# Patient Record
Sex: Female | Born: 2000 | Race: White | Hispanic: No | Marital: Single | Smoking: Never smoker
Health system: Southern US, Community
[De-identification: ages and names within clinical notes are randomized; demographics above are authoritative.]

---
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
## 2019-03-12 ENCOUNTER — Telehealth: Payer: Self-pay

## 2019-03-12 NOTE — Telephone Encounter (Signed)
Copied from [place] [id number]. Topic: General - Other >> Mar 12, 2019 12:25 PM Alease Frame wrote: Reason for CRM: patient is receiving vm concerning covid prescreening and she does not have an upcoming appt sch . Please advise error

## 2019-03-20 IMAGING — CT CT HEAD W/O CM
3 series · 15 of 47 positions shown, 18 images · non-contrast
Comparison: None.

CLINICAL DATA: 15-year-old female with frontal headache and
transient episode of right eye vision loss acute onset 2 hours ago.

EXAM:
CT HEAD WITHOUT CONTRAST
TECHNIQUE: Contiguous axial images were obtained from the base of the skull
through the vertex without intravenous contrast.

[Series 2: head wo · axial · 0.44mm/px · z∈[+52,+177]mm · 9 of 31 slices shown, 12 images]
[im 3/31  brain]
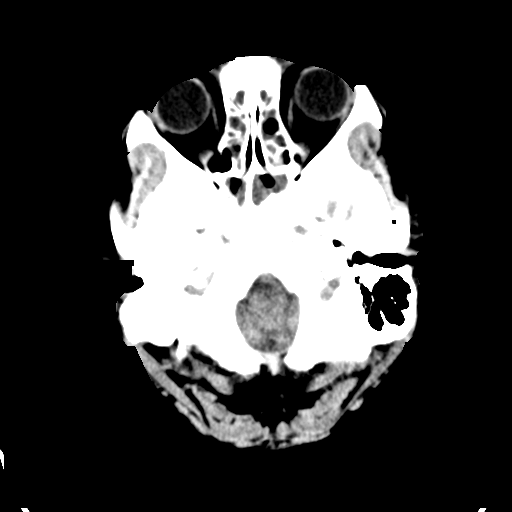
[im 3/31  bone]
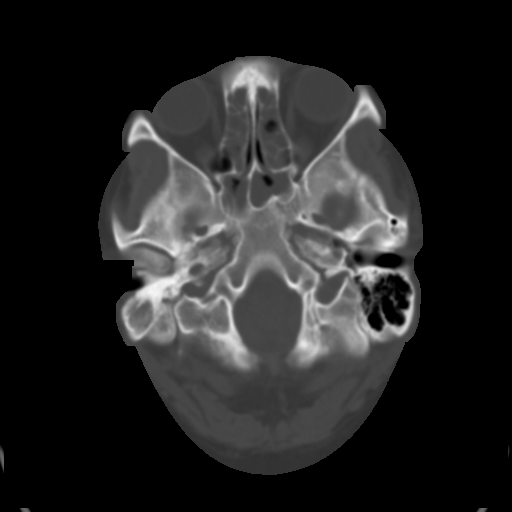
[im 6/31  brain]
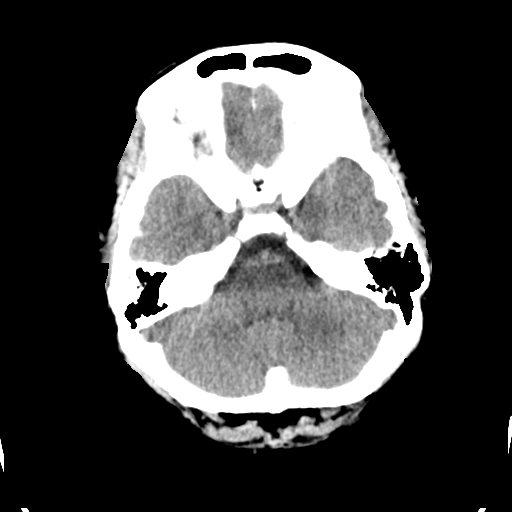
[im 9/31  brain]
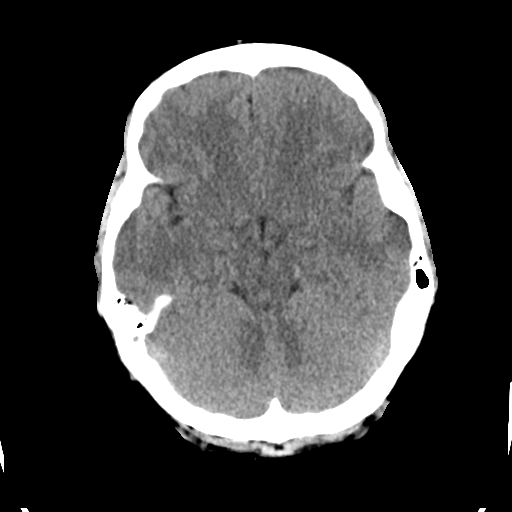
[im 12/31  brain]
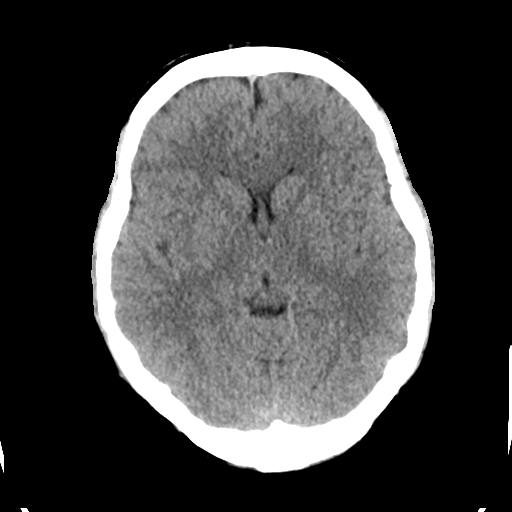
[im 16/31  brain]
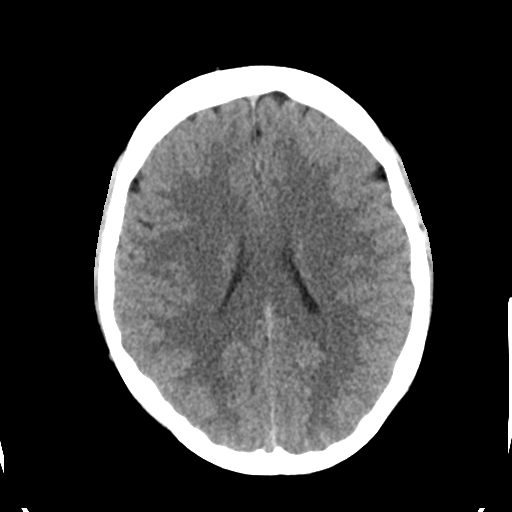
[im 16/31  bone]
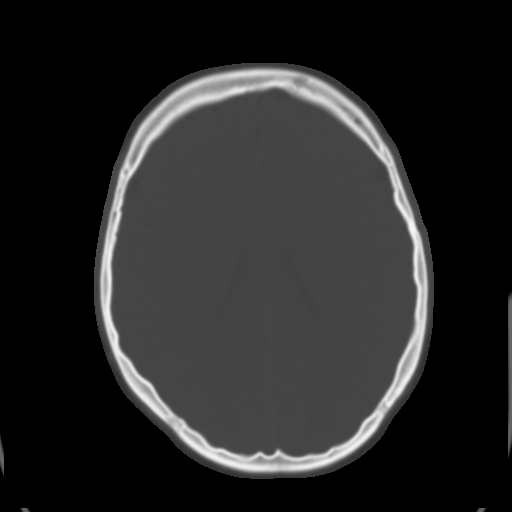
[im 19/31  brain]
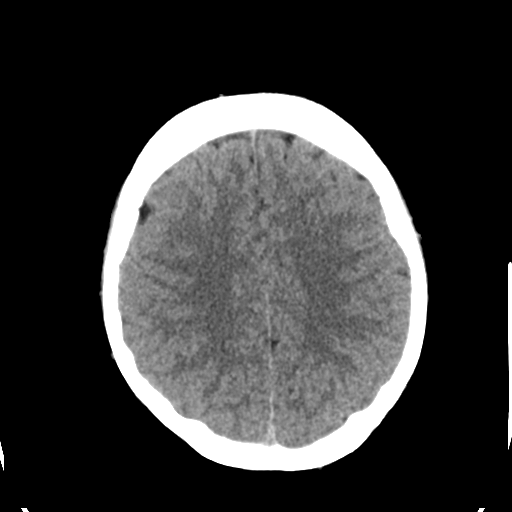
[im 22/31  brain]
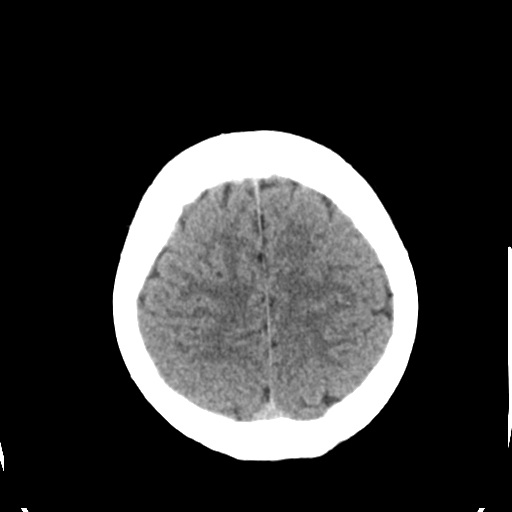
[im 25/31  brain]
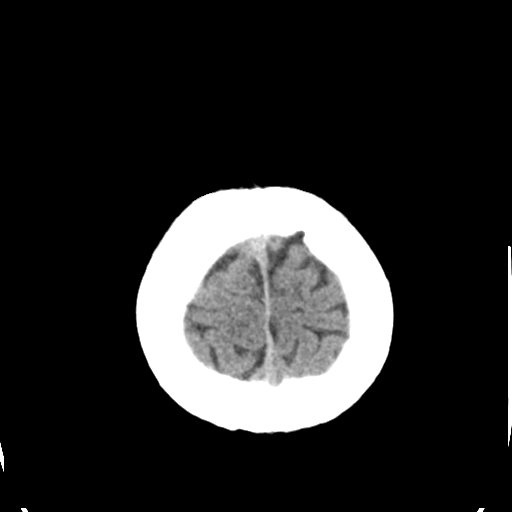
[im 28/31  brain]
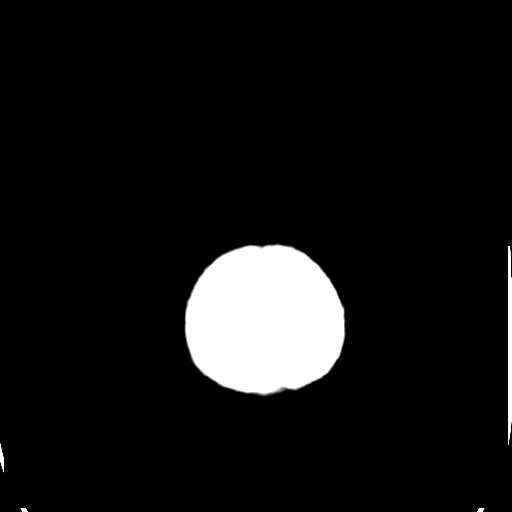
[im 28/31  bone]
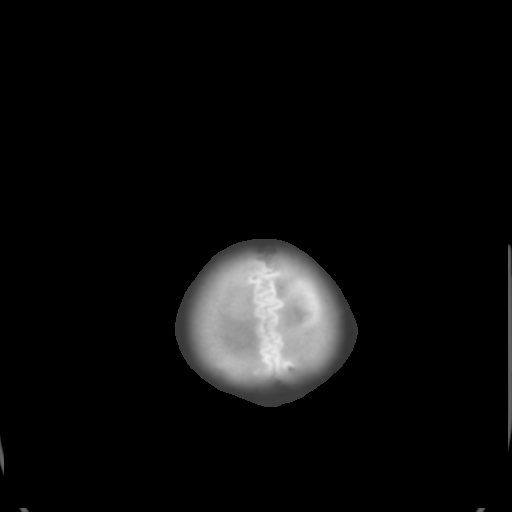

[Series 4: coronal soft tissue · coronal · 0.32mm/px · 3 of 68 slices shown]
[im 23/68  brain]
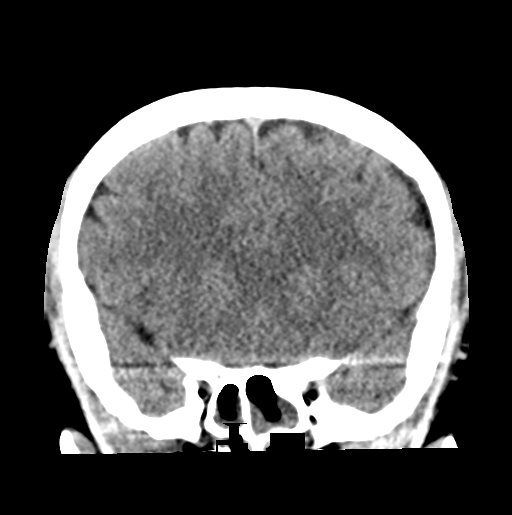
[im 30/68  brain]
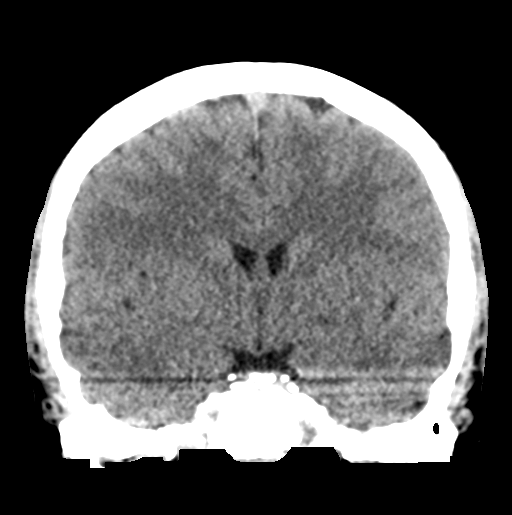
[im 38/68  brain]
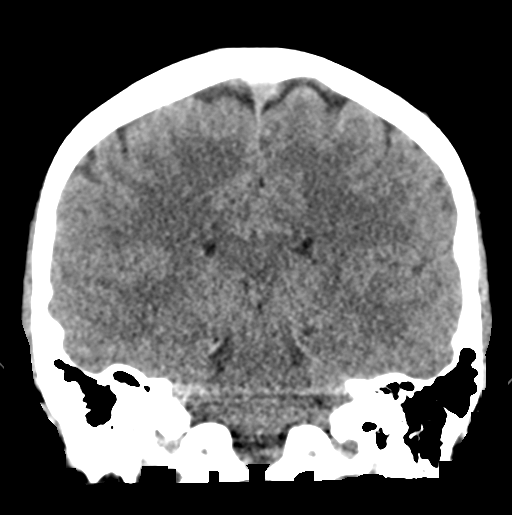

[Series 5: sagittal soft tissue · sagittal · 0.28mm/px · 3 of 57 slices shown]
[im 19/57  brain]
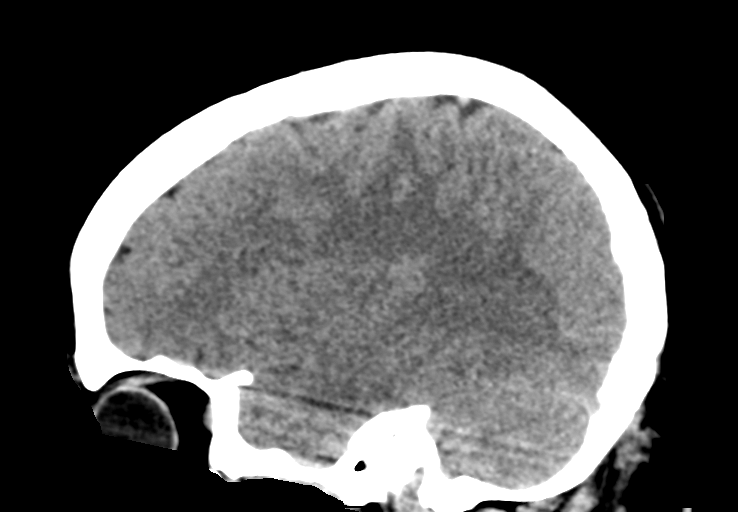
[im 29/57  brain]
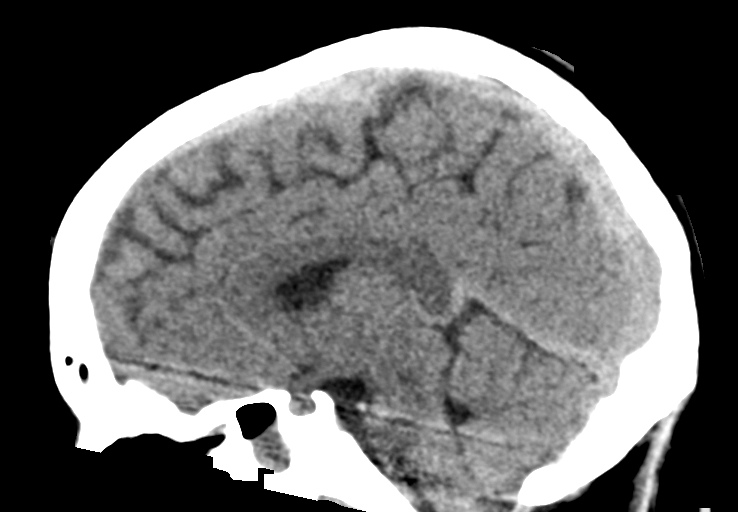
[im 38/57  brain]
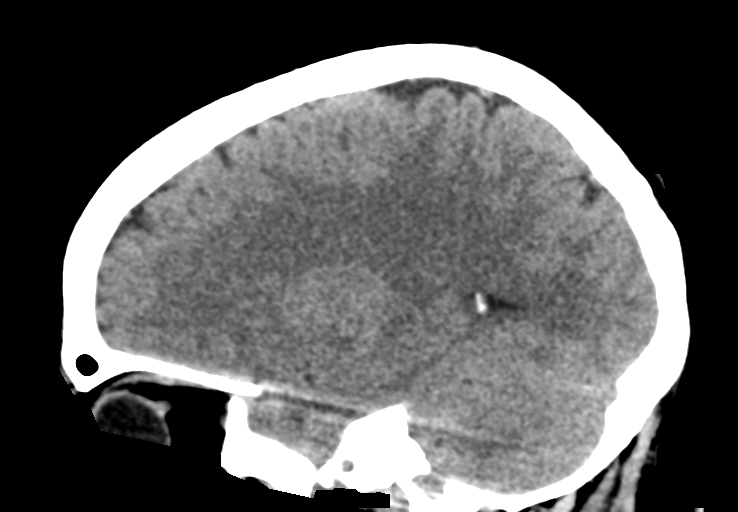

[15 of 47 positions shown; findings below may reference images not displayed]

FINDINGS: Brain: Cerebral volume is normal. No midline shift,
ventriculomegaly, mass effect, evidence of mass lesion, intracranial
hemorrhage or evidence of cortically based acute infarction.
Gray-white matter differentiation is within normal limits throughout
the brain.

Vascular: No suspicious intracranial vascular hyperdensity.

Skull: Normal.  No acute osseous abnormality identified.

Sinuses/Orbits: Subtotal opacification of the bilateral ethmoid and
sphenoid sinuses. Mucosal thickening or fluid levels in the inferior
frontal sinuses. The visible maxillary sinuses are clear except for
trace bubbly opacity on the left.

Bilateral tympanic cavities and mastoids are clear.

Other: Visualized orbits and scalp soft tissues are within normal
limits.
IMPRESSION: 1.  Normal noncontrast CT appearance of the brain.
2. Widespread bilateral paranasal sinus inflammation suggesting
acute sinusitis.

## 2019-06-01 ENCOUNTER — Other Ambulatory Visit: Payer: Self-pay

## 2019-06-01 ENCOUNTER — Ambulatory Visit: Payer: 59 | Attending: Internal Medicine

## 2019-06-01 DIAGNOSIS — Z20822 Contact with and (suspected) exposure to covid-19: Secondary | ICD-10-CM

## 2019-06-02 LAB — NOVEL CORONAVIRUS, NAA: SARS-CoV-2, NAA: NOT DETECTED

## 2020-03-08 ENCOUNTER — Ambulatory Visit: Payer: 59 | Admitting: Adult Health

## 2020-03-08 ENCOUNTER — Encounter: Payer: Self-pay | Admitting: Adult Health

## 2020-03-08 VITALS — BP 129/75 | HR 82 | Ht 64.0 in | Wt 218.0 lb

## 2020-03-08 DIAGNOSIS — N6320 Unspecified lump in the left breast, unspecified quadrant: Secondary | ICD-10-CM | POA: Diagnosis not present

## 2020-03-08 DIAGNOSIS — N6310 Unspecified lump in the right breast, unspecified quadrant: Secondary | ICD-10-CM

## 2020-03-08 NOTE — Progress Notes (Signed)
  Subjective:     Patient ID: Paula Gonzales, female   DOB: 27-Aug-2000, 19 y.o.   MRN: [id number]  HPI Paula Gonzales is a 19 year old white female,single, G0P0, in complaining of bilateral breast lumps for about a week and they feel achy. She is nursing student at [employer]  PCP is Dr Donnie Coffin.   Review of Systems Noticed lumps in both breast for a about a week Breasts are achy Has never had sex Reviewed past medical,surgical, social and family history. Reviewed medications and allergies.     Objective:   Physical Exam BP 129/75 (BP Location: Left Arm, Patient Position: Sitting, Cuff Size: Large)   Pulse 82   Ht 5\' 4"  (1.626 m)   Wt 218 lb (98.9 kg)   LMP 02/07/2020   BMI 37.42 kg/m   Skin warm and dry,  Breasts:No retraction or nipple discharge in either breasts, has bilateral regular irregularities and has 1 cm oval mass at 1-2 o'clock right breast 2 FB from areola, non tender, on the left has round 1 cm non tender mass at 9 0'clock 2 FB from areola Fall risk is low AA is 3 PHQ 9 score is 2.  Upstream - 03/08/20 1526      Pregnancy Intention Screening   Does the patient want to become pregnant in the next year? No    Does the patient's partner want to become pregnant in the next year? No    Would the patient like to discuss contraceptive options today? No      Contraception Wrap Up   Current Method Abstinence    End Method Abstinence    Contraception Counseling Provided No              Assessment:     1. Breast mass, left Will get left breast 03/10/20 11/23 at 3:50 pm at [facility]  2. Breast mass, right   Will get right breast [facility] 11/23 at 3:50 pm at [facility]  Plan:    Discussed with her that breasts have regular irregularities, fibroglandular tissue  Follow up prn

## 2020-04-12 ENCOUNTER — Encounter (HOSPITAL_COMMUNITY): Payer: Self-pay

## 2020-04-12 ENCOUNTER — Ambulatory Visit (HOSPITAL_COMMUNITY)
Admission: RE | Admit: 2020-04-12 | Discharge: 2020-04-12 | Disposition: A | Discharge status: Home / Self Care | Payer: 59 | Source: Ambulatory Visit | Attending: Adult Health | Admitting: Adult Health

## 2020-04-12 ENCOUNTER — Other Ambulatory Visit: Payer: Self-pay

## 2020-04-12 DIAGNOSIS — N6310 Unspecified lump in the right breast, unspecified quadrant: Secondary | ICD-10-CM

## 2020-04-12 DIAGNOSIS — N6320 Unspecified lump in the left breast, unspecified quadrant: Secondary | ICD-10-CM | POA: Insufficient documentation

## 2020-11-28 ENCOUNTER — Ambulatory Visit
Admission: RE | Admit: 2020-11-28 | Discharge: 2020-11-28 | Disposition: A | Discharge status: Home / Self Care | Payer: 59 | Source: Ambulatory Visit | Attending: Family Medicine | Admitting: Family Medicine

## 2020-11-28 ENCOUNTER — Other Ambulatory Visit: Payer: Self-pay

## 2020-11-28 VITALS — BP 152/82 | HR 103 | Temp 98.9°F | Resp 20

## 2020-11-28 DIAGNOSIS — J019 Acute sinusitis, unspecified: Secondary | ICD-10-CM

## 2020-11-28 DIAGNOSIS — H6591 Unspecified nonsuppurative otitis media, right ear: Secondary | ICD-10-CM | POA: Diagnosis not present

## 2020-11-28 MED ORDER — FLUCONAZOLE 150 MG PO TABS
150.0000 mg | ORAL_TABLET | Freq: Once | ORAL | 0 refills | Status: AC
Start: 2020-11-28 — End: 2020-11-28

## 2020-11-28 MED ORDER — AMOXICILLIN 875 MG PO TABS: 875.0000 mg | ORAL_TABLET | Freq: Two times a day (BID) | ORAL | 0 refills | Status: AC

## 2020-11-28 NOTE — ED Triage Notes (Signed)
Pt presents with right side ear pain and nasal congestion since Thursday

## 2020-11-28 NOTE — ED Provider Notes (Signed)
[facility]    CSN: [id number] Arrival date & time: 11/28/20  0843      History   Chief Complaint Chief Complaint  Patient presents with   Nasal Congestion   Otalgia    HPI Paula Gonzales is a 20 y.o. female.   HPI Patient presents with URI symptoms including cough, sore throat, right ear pain and pressure, asal congestion, runny nose, and sinus pressure. Diagnosed with COVID >7 days ago. Symptoms were minor.patient is fully vaccinated.  Patient has been taken Sudafed along with allergy medications without any relief of current symptoms.  She denies any shortness of breath or wheezing or chest tightness.   History reviewed. No pertinent past medical history.  There are no problems to display for this patient.   History reviewed. No pertinent surgical history.  OB History     Gravida  0   Para  0   Term  0   Preterm  0   AB  0   Living  0      SAB  0   IAB  0   Ectopic  0   Multiple  0   Live Births  0            Home Medications    Prior to Admission medications   Medication Sig Start Date End Date Taking? Authorizing Provider  amoxicillin (AMOXIL) 875 MG tablet Take 1 tablet (875 mg total) by mouth 2 (two) times daily. 11/28/20  Yes Bing Neighbors, FNP  fluconazole (DIFLUCAN) 150 MG tablet Take 1 tablet (150 mg total) by mouth once for 1 dose. Repeat if needed 11/28/20 11/28/20 Yes Bing Neighbors, FNP  cetirizine (ZYRTEC) 10 MG tablet Take by mouth.    [provider]  ibuprofen (ADVIL,MOTRIN) 200 MG tablet Take 400 mg by mouth every 6 (six) hours as needed.    [provider]  Multiple Vitamins-Minerals (ONE-A-DAY WOMENS PO) Take by mouth.    [provider]    Family History Family History  Problem Relation Age of Onset   Heart attack Paternal Grandfather    Other Paternal Grandmother        head on collision   Cancer Maternal Grandmother    Breast cancer Maternal Grandmother    Cancer  Maternal Grandfather    Diabetes Mother    Asthma Mother    Breast cancer Maternal Aunt     Social History Social History   Tobacco Use   Smoking status: Never   Smokeless tobacco: Never  Vaping Use   Vaping Use: Never used  Substance Use Topics   Alcohol use: Yes    Comment: occ   Drug use: No     Allergies   Patient has no known allergies.   Review of Systems Review of Systems Pertinent negatives listed in HPI   Physical Exam Triage Vital Signs ED Triage Vitals  Enc Vitals Group     BP 11/28/20 0911 (!) 152/82     Pulse Rate 11/28/20 0911 (!) 103     Resp 11/28/20 0911 20     Temp 11/28/20 0911 98.9 F (37.2 C)     Temp src --      SpO2 11/28/20 0911 98 %     Weight --      Height --      Head Circumference --      Peak Flow --      Pain Score 11/28/20 0910 4  Pain Loc --      Pain Edu? --      Excl. in GC? --    No data found.  Updated Vital Signs BP (!) 152/82   Pulse (!) 103   Temp 98.9 F (37.2 C)   Resp 20   LMP 11/27/2020   SpO2 98%   Visual Acuity Right Eye Distance:   Left Eye Distance:   Bilateral Distance:    Right Eye Near:   Left Eye Near:    Bilateral Near:     Physical Exam  General Appearance:    Alert, cooperative, no distress  HENT:   Normocephalic, right ear TM erythematous, bulging swelling canal, left ear  normal, nares mucosal edema with congestion, rhinorrhea, oropharynx    Eyes:    PERRL, conjunctiva/corneas clear, EOM's intact       Lungs:     Clear to auscultation bilaterally, respirations unlabored  Heart:    Regular rate and rhythm  Neurologic:   Awake, alert, oriented x 3. No apparent focal neurological           defect.     UC Treatments / Results  Labs (all labs ordered are listed, but only abnormal results are displayed) Labs Reviewed - No data to display  EKG   Radiology No results found.  Procedures Procedures (including critical care time)  Medications Ordered in UC Medications - No  data to display  Initial Impression / Assessment and Plan / UC Course  I have reviewed the triage vital signs and the nursing notes.  Pertinent labs & imaging results that were available during my care of the patient were reviewed by me and considered in my medical decision making (see chart for details).    Right otitis media with effusion and acute sinus infection Treatment with amoxicillin and Diflucan. Continue Sudafed and cetirizine.  Return if symptoms worsen or do not improve Final Clinical Impressions(s) / UC Diagnoses   Final diagnoses:  Right otitis media with effusion  Acute non-recurrent sinusitis, unspecified location   Discharge Instructions   None    ED Prescriptions     Medication Sig Dispense Auth. Provider   amoxicillin (AMOXIL) 875 MG tablet Take 1 tablet (875 mg total) by mouth 2 (two) times daily. 20 tablet Bing Neighbors, FNP   fluconazole (DIFLUCAN) 150 MG tablet Take 1 tablet (150 mg total) by mouth once for 1 dose. Repeat if needed 2 tablet Bing Neighbors, FNP      PDMP not reviewed this encounter.   Bing Neighbors, [place] 11/28/20 [phone]

## 2020-12-11 ENCOUNTER — Ambulatory Visit
Admission: RE | Admit: 2020-12-11 | Discharge: 2020-12-11 | Disposition: A | Discharge status: Home / Self Care | Payer: 59 | Source: Ambulatory Visit | Attending: Pediatrics | Admitting: Pediatrics

## 2020-12-11 ENCOUNTER — Other Ambulatory Visit: Payer: Self-pay

## 2020-12-11 VITALS — BP 136/77 | HR 77 | Temp 98.8°F | Resp 16

## 2020-12-11 DIAGNOSIS — H9191 Unspecified hearing loss, right ear: Secondary | ICD-10-CM

## 2020-12-11 NOTE — ED Triage Notes (Signed)
History of Covid on July 4th.  Had ear infection on 7/11.  States ear continues to feel congestion and painful at times.

## 2020-12-12 NOTE — ED Provider Notes (Signed)
[facility]    CSN: [id number] Arrival date & time: 12/11/20  1334      History   Chief Complaint No chief complaint on file.   HPI Paula Gonzales is a 20 y.o. female.   Pt reports she is having trouble hearing out of her right ear.  Pt had ovid July 4.  Pt has had a full course of antibiotics for ear infection but still has hearing problems.   The history is provided by the patient. No language interpreter was used.   History reviewed. No pertinent past medical history.  There are no problems to display for this patient.   History reviewed. No pertinent surgical history.  OB History     Gravida  0   Para  0   Term  0   Preterm  0   AB  0   Living  0      SAB  0   IAB  0   Ectopic  0   Multiple  0   Live Births  0            Home Medications    Prior to Admission medications   Medication Sig Start Date End Date Taking? Authorizing Provider  amoxicillin (AMOXIL) 875 MG tablet Take 1 tablet (875 mg total) by mouth 2 (two) times daily. 11/28/20   Bing Neighbors, FNP  cetirizine (ZYRTEC) 10 MG tablet Take by mouth.    [provider]  ibuprofen (ADVIL,MOTRIN) 200 MG tablet Take 400 mg by mouth every 6 (six) hours as needed.    [provider]  Multiple Vitamins-Minerals (ONE-A-DAY WOMENS PO) Take by mouth.    [provider]    Family History Family History  Problem Relation Age of Onset   Heart attack Paternal Grandfather    Other Paternal Grandmother        head on collision   Cancer Maternal Grandmother    Breast cancer Maternal Grandmother    Cancer Maternal Grandfather    Diabetes Mother    Asthma Mother    Breast cancer Maternal Aunt     Social History Social History   Tobacco Use   Smoking status: Never   Smokeless tobacco: Never  Vaping Use   Vaping Use: Never used  Substance Use Topics   Alcohol use: Yes    Comment: occ   Drug use: No     Allergies   Patient has no  known allergies.   Review of Systems Review of Systems  All other systems reviewed and are negative.   Physical Exam Triage Vital Signs ED Triage Vitals [12/11/20 1440]  Enc Vitals Group     BP 136/77     Pulse Rate 77     Resp 16     Temp 98.8 F (37.1 C)     Temp Source Oral     SpO2 97 %     Weight      Height      Head Circumference      Peak Flow      Pain Score 5     Pain Loc      Pain Edu?      Excl. in GC?    No data found.  Updated Vital Signs BP 136/77 (BP Location: Right Arm)   Pulse 77   Temp 98.8 F (37.1 C) (Oral)   Resp 16   LMP 11/27/2020 (Approximate)   SpO2 97%   Visual Acuity Right Eye  Distance:   Left Eye Distance:   Bilateral Distance:    Right Eye Near:   Left Eye Near:    Bilateral Near:     Physical Exam Vitals and nursing note reviewed.  Constitutional:      Appearance: She is well-developed.  HENT:     Head: Normocephalic.     Right Ear: Tympanic membrane normal.     Left Ear: Tympanic membrane normal.     Nose: Nose normal.     Mouth/Throat:     Mouth: Mucous membranes are moist.  Pulmonary:     Effort: Pulmonary effort is normal.  Musculoskeletal:        General: Normal range of motion.     Cervical back: Normal range of motion.  Neurological:     Mental Status: She is alert and oriented to person, place, and time.     UC Treatments / Results  Labs (all labs ordered are listed, but only abnormal results are displayed) Labs Reviewed - No data to display  EKG   Radiology No results found.  Procedures Procedures (including critical care time)  Medications Ordered in UC Medications - No data to display  Initial Impression / Assessment and Plan / UC Course  I have reviewed the triage vital signs and the nursing notes.  Pertinent labs & imaging results that were available during my care of the patient were reviewed by me and considered in my medical decision making (see chart for details).    MDM:  TM's  are clear.  I advised follow up with ENT for evaluation  Final Clinical Impressions(s) / UC Diagnoses   Final diagnoses:  Hearing loss of right ear, unspecified hearing loss type   Discharge Instructions   None    ED Prescriptions   None    PDMP not reviewed this encounter. An After Visit Summary was printed and given to the patient.    Elson Areas, [place] 12/12/20 1326

## 2022-10-12 IMAGING — US US BREAST*R* LIMITED INC AXILLA
1 series · 4 of 4 positions shown · non-contrast
Comparison: None.

CLINICAL DATA: 19-year-old female with palpable areas of concern in
each breast.

EXAM:
ULTRASOUND OF THE BILATERAL BREAST

[Series 1: us breast*right* limited inc axilla · 0.07mm/px · 4 of 4 slices shown]
[im 1/4]
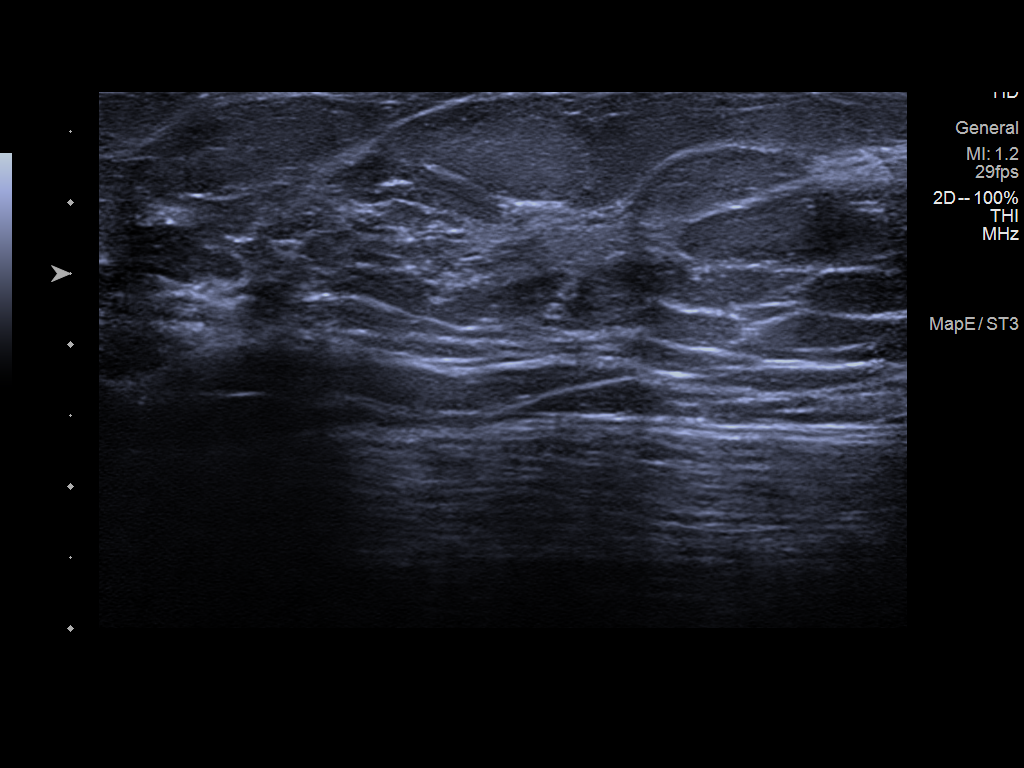
[im 2/4]
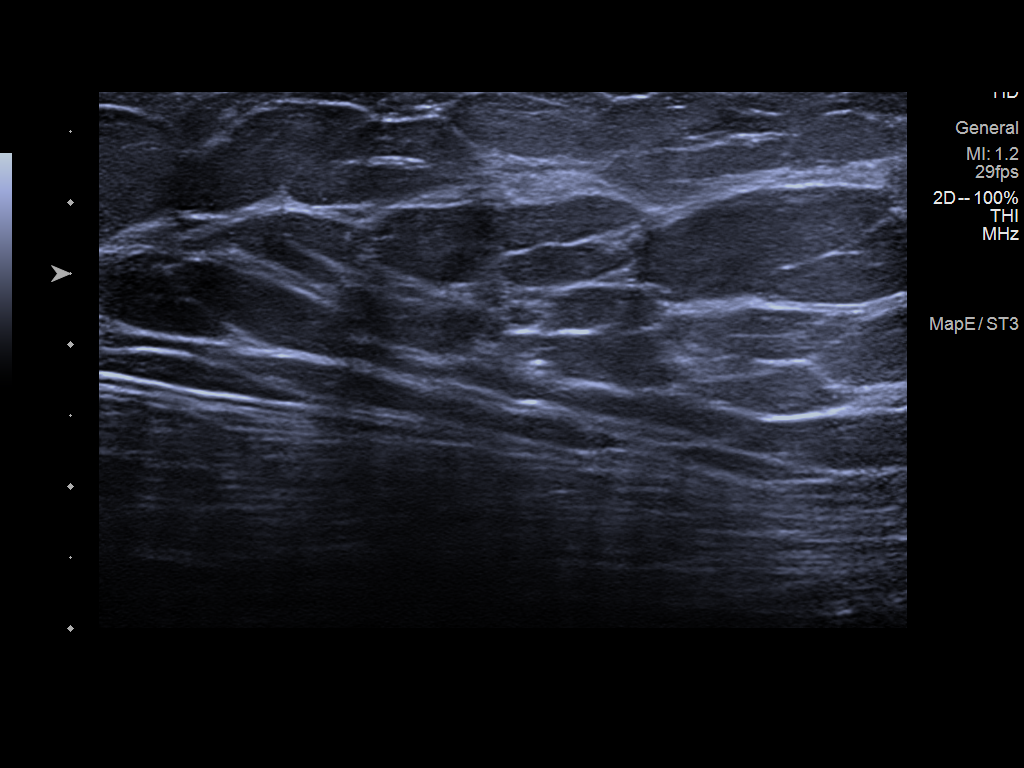
[im 3/4]
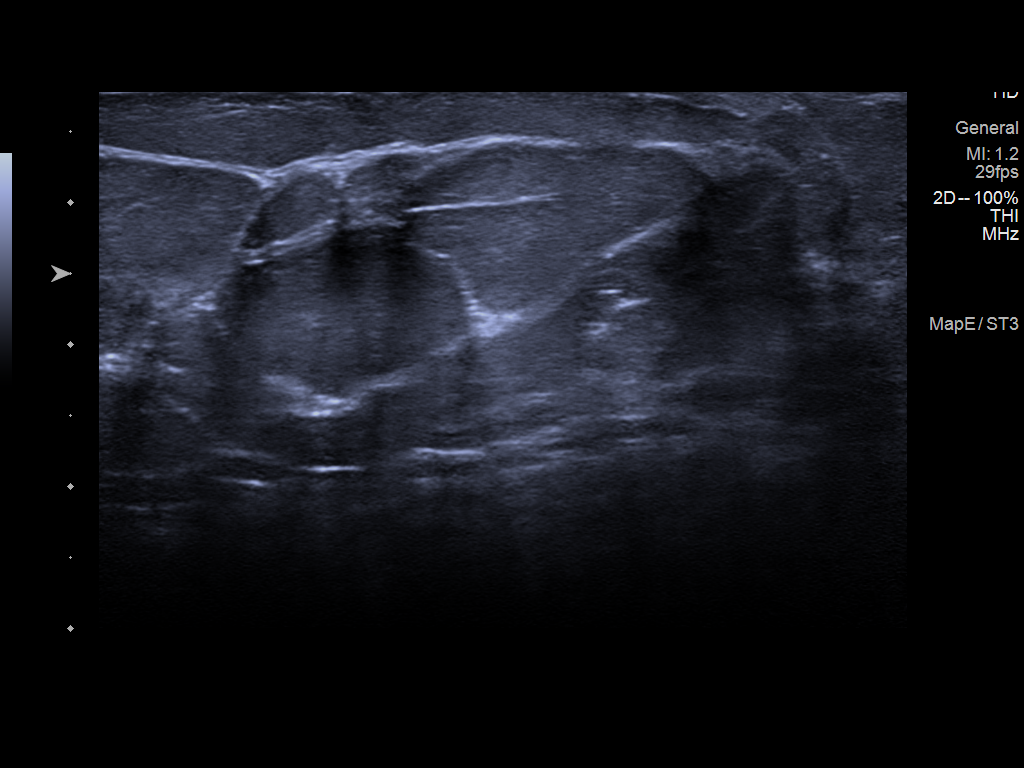
[im 4/4]
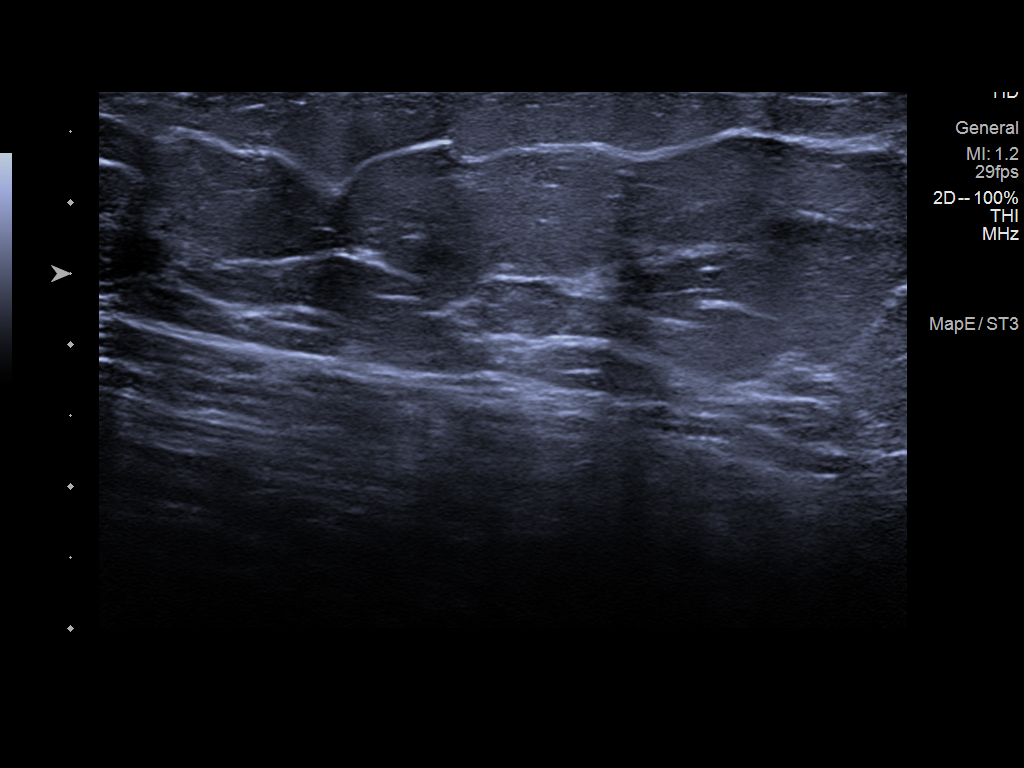

[4 of 4 positions shown; findings below may reference images not displayed]

FINDINGS: Physical examination site of palpable concern in the upper right
breast reveals a focal area of soft thickening at the approximate 12
o'clock position. Physical examination at site of palpable concern
in the inner left breast reveals an additional focal area of soft
thickening at the approximate 9 o'clock position.

Targeted ultrasound of the bilateral breasts was performed in the
regions of palpable concern. No suspicious masses or abnormality
seen, only heterogeneous fibroglandular tissue identified.
IMPRESSION: No sonographic abnormalities at the sites of palpable concern in
each breast.

RECOMMENDATION:
1. Recommend further management the bilateral palpable areas of
concern be based on clinical assessment.

2. Screening mammogram at age 39 unless there are persistent or
intervening clinical concerns. (Code:[id number])

I have discussed the findings and recommendations with the patient.
If applicable, a reminder letter will be sent to the patient
regarding the next appointment.

BI-RADS CATEGORY  1: Negative.
